# Patient Record
Sex: Female | Born: 1975 | Race: White | Hispanic: No | Smoking: Never smoker
Health system: Southern US, Community
[De-identification: ages and names within clinical notes are randomized; demographics above are authoritative.]

## PROBLEM LIST (undated history)

## (undated) DIAGNOSIS — D6851 Activated protein C resistance: Secondary | ICD-10-CM

## (undated) DIAGNOSIS — K589 Irritable bowel syndrome without diarrhea: Secondary | ICD-10-CM

## (undated) DIAGNOSIS — I10 Essential (primary) hypertension: Secondary | ICD-10-CM

## (undated) HISTORY — PX: TONSILLECTOMY: SUR1361

## (undated) HISTORY — DX: Activated protein C resistance: D68.51

## (undated) HISTORY — DX: Irritable bowel syndrome without diarrhea: K58.9

## (undated) HISTORY — DX: Essential (primary) hypertension: I10

---

## 2010-12-11 ENCOUNTER — Ambulatory Visit: Payer: Self-pay | Admitting: Internal Medicine

## 2010-12-17 ENCOUNTER — Ambulatory Visit: Payer: Self-pay | Admitting: Family Medicine

## 2010-12-18 ENCOUNTER — Emergency Department: Payer: Self-pay | Admitting: Emergency Medicine

## 2010-12-19 ENCOUNTER — Emergency Department: Payer: Self-pay | Admitting: *Deleted

## 2010-12-23 ENCOUNTER — Ambulatory Visit: Payer: Self-pay | Admitting: Surgery

## 2012-03-01 HISTORY — PX: AUGMENTATION MAMMAPLASTY: SUR837

## 2015-03-02 HISTORY — PX: MYOMECTOMY: SHX85

## 2017-05-09 LAB — HM PAP SMEAR: HM Pap smear: NEGATIVE

## 2019-03-19 ENCOUNTER — Other Ambulatory Visit: Payer: Self-pay | Admitting: Family

## 2019-03-19 DIAGNOSIS — Z1231 Encounter for screening mammogram for malignant neoplasm of breast: Secondary | ICD-10-CM

## 2019-03-27 ENCOUNTER — Ambulatory Visit
Admission: RE | Admit: 2019-03-27 | Discharge: 2019-03-27 | Disposition: A | Discharge status: Home / Self Care | Payer: Managed Care, Other (non HMO) | Source: Ambulatory Visit | Attending: Family | Admitting: Family

## 2019-03-27 ENCOUNTER — Other Ambulatory Visit: Payer: Self-pay

## 2019-03-27 ENCOUNTER — Other Ambulatory Visit: Payer: Self-pay | Admitting: Family

## 2019-03-27 ENCOUNTER — Encounter (INDEPENDENT_AMBULATORY_CARE_PROVIDER_SITE_OTHER): Payer: Self-pay

## 2019-03-27 DIAGNOSIS — Z1231 Encounter for screening mammogram for malignant neoplasm of breast: Secondary | ICD-10-CM | POA: Insufficient documentation

## 2019-04-11 ENCOUNTER — Other Ambulatory Visit: Payer: Self-pay | Admitting: Family

## 2019-04-11 DIAGNOSIS — N6310 Unspecified lump in the right breast, unspecified quadrant: Secondary | ICD-10-CM

## 2019-04-11 DIAGNOSIS — N6489 Other specified disorders of breast: Secondary | ICD-10-CM

## 2019-04-11 DIAGNOSIS — R928 Other abnormal and inconclusive findings on diagnostic imaging of breast: Secondary | ICD-10-CM

## 2019-04-17 ENCOUNTER — Other Ambulatory Visit: Payer: Managed Care, Other (non HMO)

## 2019-04-17 ENCOUNTER — Ambulatory Visit: Payer: Managed Care, Other (non HMO)

## 2019-04-20 ENCOUNTER — Ambulatory Visit
Admission: RE | Admit: 2019-04-20 | Discharge: 2019-04-20 | Disposition: A | Discharge status: Home / Self Care | Payer: Managed Care, Other (non HMO) | Source: Ambulatory Visit | Attending: Family | Admitting: Family

## 2019-04-20 ENCOUNTER — Other Ambulatory Visit: Payer: Self-pay | Admitting: Family

## 2019-04-20 DIAGNOSIS — N6489 Other specified disorders of breast: Secondary | ICD-10-CM

## 2019-04-20 DIAGNOSIS — R928 Other abnormal and inconclusive findings on diagnostic imaging of breast: Secondary | ICD-10-CM | POA: Diagnosis not present

## 2019-04-20 DIAGNOSIS — N6310 Unspecified lump in the right breast, unspecified quadrant: Secondary | ICD-10-CM | POA: Insufficient documentation

## 2019-11-06 ENCOUNTER — Ambulatory Visit: Payer: Self-pay | Admitting: Family Medicine

## 2019-11-06 ENCOUNTER — Encounter: Payer: Self-pay | Admitting: Family Medicine

## 2019-11-26 ENCOUNTER — Other Ambulatory Visit: Payer: Self-pay | Admitting: Family Medicine

## 2019-11-26 ENCOUNTER — Other Ambulatory Visit: Payer: Self-pay

## 2019-11-26 ENCOUNTER — Encounter: Payer: Self-pay | Admitting: Family Medicine

## 2019-11-26 ENCOUNTER — Ambulatory Visit: Payer: Managed Care, Other (non HMO) | Admitting: Family Medicine

## 2019-11-26 VITALS — BP 127/72 | HR 80 | Temp 98.6°F | Resp 18 | Ht 63.0 in | Wt 143.2 lb

## 2019-11-26 DIAGNOSIS — R000 Tachycardia, unspecified: Secondary | ICD-10-CM

## 2019-11-26 DIAGNOSIS — Z7689 Persons encountering health services in other specified circumstances: Secondary | ICD-10-CM | POA: Insufficient documentation

## 2019-11-26 DIAGNOSIS — I10 Essential (primary) hypertension: Secondary | ICD-10-CM | POA: Diagnosis not present

## 2019-11-26 DIAGNOSIS — K589 Irritable bowel syndrome without diarrhea: Secondary | ICD-10-CM

## 2019-11-26 DIAGNOSIS — D6851 Activated protein C resistance: Secondary | ICD-10-CM | POA: Diagnosis not present

## 2019-11-26 DIAGNOSIS — Z23 Encounter for immunization: Secondary | ICD-10-CM | POA: Insufficient documentation

## 2019-11-26 MED ORDER — BLOOD PRESSURE KIT: PACK | 0 refills | Status: AC

## 2019-11-26 NOTE — Assessment & Plan Note (Signed)
Hx of hypertension, started on lisinopril 2.5mg  in August of 2021, labs completed just prior to starting on lisinopril.  No repeat labs to evaluate kidney function s/p beginning an ACEi, will order this today.  Appears wrist cuff is not as accurate and explained to patient, would likely benefit more from an upper arm BP cuff and can write a prescription for this.  Will have her take her BP 1-2x per day for the next 2 weeks and if showing a consistent elevation in BP > 130/80, will increase lisinopril to 5mg  daily.  Plan: 1. Have labs drawn in the next 1-2 weeks 2. Begin using new BP cuff that is for the upper arm 3. Log BP 1-2x per day for the next 2 weeks and contact if BP is consistently > 130/80 4. RTC in 3 months

## 2019-11-26 NOTE — Assessment & Plan Note (Signed)
Tachycardia upon entering clinic which then resolved and had a HR in the 80's.  Family history of thyroid disorder, will have labs ordered for evaluation of tachycardia.  Reports HR at home in the 60's typically.  Plan: 1. Have labs drawn in the next 1-2 weeks

## 2019-11-26 NOTE — Assessment & Plan Note (Signed)
Reports stable and has been taking ASA 81mg  for this for control.

## 2019-11-26 NOTE — Assessment & Plan Note (Signed)
Long standing history, has been taking a probiotic, fiber and pepcid for control.  Denies any concerns currently.

## 2019-11-26 NOTE — Progress Notes (Signed)
Subjective:    Patient ID: Megan Ortega, female    DOB: Jan 11, 1976, 44 y.o.   MRN: [id number]  Megan Ortega is a 44 y.o. female presenting on 11/26/2019 for Establish Care (hypertension, pt started bp medication in August and been having some elevated bp reading ranging 130/150- 75-88 )   HPI  Previous PCP was at [facility] in [place].  Records will not be requested, as are available in [place].  Past medical, family, and surgical history reviewed w/ pt.  She has acute concerns today for elevated blood pressure readings while at home.  She has been taking her blood pressure with a wrist cuff and has readings that vary 115-155 SBP/78-93 DBP.  Reports no change in how she is feeling with high or low BP readings.  Today in clinic her BP was 127/72 and then taken with her wrist cuff showing a SBP of 144.  Has taken her BP medications today.     Depression screen PHQ 2/9 11/26/2019  Decreased Interest 0  Down, Depressed, Hopeless 0  PHQ - 2 Score 0    Social History   Tobacco Use  . Smoking status: Never Smoker  . Smokeless tobacco: Never Used  Vaping Use  . Vaping Use: Never used  Substance Use Topics  . Alcohol use: Never  . Drug use: Never    Review of Systems  Constitutional: Negative.   HENT: Negative.   Eyes: Negative.   Respiratory: Negative.   Cardiovascular: Negative.   Gastrointestinal: Negative.   Endocrine: Negative.   Genitourinary: Negative.   Musculoskeletal: Negative.   Skin: Negative.   Allergic/Immunologic: Negative.   Neurological: Negative.   Hematological: Negative.   Psychiatric/Behavioral: Negative.    Per HPI unless specifically indicated above     Objective:    BP 127/72 (BP Location: Left Arm, Patient Position: Sitting, Cuff Size: Normal)   Pulse 80   Temp 98.6 F (37 C) (Oral)   Resp 18   Ht 5' 3" (1.6 m)   Wt 143 lb 3.2 oz (65 kg)   SpO2 100%   BMI 25.37 kg/m   Wt Readings from Last 3  Encounters:  11/26/19 143 lb 3.2 oz (65 kg)    Physical Exam Vitals reviewed.  Constitutional:      General: She is not in acute distress.    Appearance: Normal appearance. She is well-developed, well-groomed and overweight. She is not ill-appearing or toxic-appearing.  HENT:     Head: Normocephalic and atraumatic.     Nose:     Comments: Lizbeth Bark is in place, covering mouth and nose. Eyes:     General: Lids are normal. Vision grossly intact.        Right eye: No discharge.        Left eye: No discharge.     Extraocular Movements: Extraocular movements intact.     Conjunctiva/sclera: Conjunctivae normal.     Pupils: Pupils are equal, round, and reactive to light.  Cardiovascular:     Rate and Rhythm: Normal rate and regular rhythm.     Pulses: Normal pulses.     Heart sounds: Normal heart sounds. No murmur heard.  No friction rub. No gallop.   Pulmonary:     Effort: Pulmonary effort is normal. No respiratory distress.     Breath sounds: Normal breath sounds.  Skin:    General: Skin is warm and dry.     Capillary Refill: Capillary refill takes less than 2 seconds.  Neurological:     General: No focal deficit present.     Mental Status: She is alert and oriented to person, place, and time.  Psychiatric:        Attention and Perception: Attention and perception normal.        Mood and Affect: Mood and affect normal.        Speech: Speech normal.        Behavior: Behavior normal. Behavior is cooperative.        Thought Content: Thought content normal.        Cognition and Memory: Cognition and memory normal.        Judgment: Judgment normal.    No results found for this or any previous visit.    Assessment & Plan:   Problem List Items Addressed This Visit      Cardiovascular and Mediastinum   Essential hypertension    Hx of hypertension, started on lisinopril 2.61m in August of 2021, labs completed just prior to starting on lisinopril.  No repeat labs to evaluate kidney  function s/p beginning an ACEi, will order this today.  Appears wrist cuff is not as accurate and explained to patient, would likely benefit more from an upper arm BP cuff and can write a prescription for this.  Will have her take her BP 1-2x per day for the next 2 weeks and if showing a consistent elevation in BP > 130/80, will increase lisinopril to 520mdaily.  Plan: 1. Have labs drawn in the next 1-2 weeks 2. Begin using new BP cuff that is for the upper arm 3. Log BP 1-2x per day for the next 2 weeks and contact if BP is consistently > 130/80 4. RTC in 3 months      Relevant Medications   lisinopril (ZESTRIL) 2.5 MG tablet   aspirin 81 MG chewable tablet   Blood Pressure KIT     Digestive   IBS (irritable bowel syndrome)    Long standing history, has been taking a probiotic, fiber and pepcid for control.  Denies any concerns currently.      Relevant Medications   Famotidine (PEPCID AC PO)   Psyllium (METAMUCIL) 0.36 g CAPS   Probiotic Product (PROBIOTIC-10 PO)     Hematopoietic and Hemostatic   Factor V Leiden mutation ([facility]   Reports stable and has been taking ASA 8137mor this for control.        Other   Tachycardia    Tachycardia upon entering clinic which then resolved and had a HR in the 80's.  Family history of thyroid disorder, will have labs ordered for evaluation of tachycardia.  Reports HR at home in the 60's typically.  Plan: 1. Have labs drawn in the next 1-2 weeks      Relevant Orders   CMP14+EGFR   CBC with Differential   TSH + free T4   Encounter to establish care with new doctor - Primary    [facility].  [facility]  [facility] to reflect results.  Plan: 1. Have labs drawn in the next 1-2 weeks 2. RTC in 3 months for CPE         Meds ordered this encounter    Medications  . Blood Pressure KIT    Sig: Please allow to pick from covered machines  Dispense:  1 kit    Refill:  0    Follow up plan: Return in about 3 months (around 02/25/2020) for CPE.   Harlin Rain, [place] Family Nurse Practitioner [facility] 11/26/2019, 12:00 PM

## 2019-11-26 NOTE — Assessment & Plan Note (Signed)
New patient establishment at [facility] for primary care services.  Previous PCP was with [facility] and previous OB/GYN with [address].  Records are in [facility], will obtain and update our chart to reflect results.  Plan: 1. Have labs drawn in the next 1-2 weeks 2. RTC in 3 months for CPE

## 2019-11-26 NOTE — Patient Instructions (Signed)
Have your labs drawn and we will contact you with the results.  Take your blood pressure twice per day with an arm cuff and record these numbers.  If you are finding you are consistently greater than 130/80, please contact me sooner and we can discuss increasing your lisinopril.  Continue lisinopril 2.5mg  once daily.    Some of the possible side effects are:  - angioedema: swelling of lips, mouth, and tongue.  If this rare side effect occurs, please go to ED. - cough: you could develop a dry, hacking cough caused by this medicine.  If it occurs, it will go away after stopping this medicine.  Call the clinic before stopping the medication. - kidney damage: we will monitor your labs when we start this medicine and at least one time per year.  If you do not have an change in kidney function when starting this medicine, it will provide kidney protection over time.  We will plan to see you back in 3 months for your physical  You will receive a survey after today's visit either digitally by e-mail or paper by USPS mail. Your experiences and feedback matter to [place].  Please respond so we know how we are doing as we provide care for you.  Call us with any questions/concerns/needs.  It is my goal to be available to you for your health concerns.  Thanks for choosing me to be a partner in your healthcare needs!  Charlaine Dalton, FNP-C Family Nurse Practitioner [facility] Phone: [phone]

## 2019-11-27 LAB — CMP14+EGFR
ALT: 13 IU/L (ref 0–32)
AST: 20 IU/L (ref 0–40)
Albumin/Globulin Ratio: 2 (ref 1.2–2.2)
Albumin: 4.6 g/dL (ref 3.8–4.8)
Alkaline Phosphatase: 58 IU/L (ref 44–121)
BUN/Creatinine Ratio: 18 (ref 9–23)
BUN: 13 mg/dL (ref 6–24)
Bilirubin Total: 0.4 mg/dL (ref 0.0–1.2)
CO2: 25 mmol/L (ref 20–29)
Calcium: 8.8 mg/dL (ref 8.7–10.2)
Chloride: 99 mmol/L (ref 96–106)
Creatinine, Ser: 0.72 mg/dL (ref 0.57–1.00)
GFR calc Af Amer: 118 mL/min/{1.73_m2} (ref >59)
GFR calc non Af Amer: 102 mL/min/{1.73_m2} (ref >59)
Globulin, Total: 2.3 g/dL (ref 1.5–4.5)
Glucose: 87 mg/dL (ref 65–99)
Potassium: 4.1 mmol/L (ref 3.5–5.2)
Sodium: 139 mmol/L (ref 134–144)
Total Protein: 6.9 g/dL (ref 6.0–8.5)

## 2019-11-27 LAB — CBC WITH DIFFERENTIAL/PLATELET
Basophils Absolute: 0.1 10*3/uL (ref 0.0–0.2)
Basos: 1 %
EOS (ABSOLUTE): 0.2 10*3/uL (ref 0.0–0.4)
Eos: 3 %
Hematocrit: 42.5 % (ref 34.0–46.6)
Hemoglobin: 14.2 g/dL (ref 11.1–15.9)
Immature Grans (Abs): 0 10*3/uL (ref 0.0–0.1)
Immature Granulocytes: 0 %
Lymphocytes Absolute: 1.9 10*3/uL (ref 0.7–3.1)
Lymphs: 27 %
MCH: 30.4 pg (ref 26.6–33.0)
MCHC: 33.4 g/dL (ref 31.5–35.7)
MCV: 91 fL (ref 79–97)
Monocytes Absolute: 0.4 10*3/uL (ref 0.1–0.9)
Monocytes: 6 %
Neutrophils Absolute: 4.5 10*3/uL (ref 1.4–7.0)
Neutrophils: 63 %
Platelets: 301 10*3/uL (ref 150–450)
RBC: 4.67 x10E6/uL (ref 3.77–5.28)
RDW: 12.7 % (ref 11.7–15.4)
WBC: 7.1 10*3/uL (ref 3.4–10.8)

## 2019-11-27 LAB — TSH+FREE T4
Free T4: 1.45 ng/dL (ref 0.82–1.77)
TSH: 0.944 u[IU]/mL (ref 0.450–4.500)

## 2019-12-04 ENCOUNTER — Encounter: Payer: Self-pay | Admitting: Family Medicine

## 2019-12-04 ENCOUNTER — Other Ambulatory Visit: Payer: Self-pay | Admitting: Family Medicine

## 2019-12-04 DIAGNOSIS — I10 Essential (primary) hypertension: Secondary | ICD-10-CM

## 2019-12-04 MED ORDER — LISINOPRIL 2.5 MG PO TABS: 2.5000 mg | ORAL_TABLET | Freq: Every day | ORAL | 1 refills | Status: DC

## 2019-12-10 ENCOUNTER — Telehealth: Payer: Self-pay

## 2019-12-10 DIAGNOSIS — I10 Essential (primary) hypertension: Secondary | ICD-10-CM

## 2019-12-10 MED ORDER — LISINOPRIL 2.5 MG PO TABS: 2.5000 mg | ORAL_TABLET | Freq: Every day | ORAL | 1 refills | Status: DC

## 2019-12-10 NOTE — Telephone Encounter (Signed)
Prescription sent to [facility] in [place].

## 2019-12-12 ENCOUNTER — Other Ambulatory Visit: Payer: Self-pay

## 2019-12-12 ENCOUNTER — Ambulatory Visit (INDEPENDENT_AMBULATORY_CARE_PROVIDER_SITE_OTHER): Payer: Managed Care, Other (non HMO)

## 2019-12-12 DIAGNOSIS — Z23 Encounter for immunization: Secondary | ICD-10-CM

## 2020-02-27 ENCOUNTER — Other Ambulatory Visit: Payer: Self-pay | Admitting: Family Medicine

## 2020-02-27 DIAGNOSIS — I10 Essential (primary) hypertension: Secondary | ICD-10-CM

## 2020-03-15 ENCOUNTER — Other Ambulatory Visit: Payer: Self-pay

## 2020-03-15 ENCOUNTER — Other Ambulatory Visit: Payer: Managed Care, Other (non HMO)

## 2020-03-15 DIAGNOSIS — Z20822 Contact with and (suspected) exposure to covid-19: Secondary | ICD-10-CM

## 2020-03-18 LAB — NOVEL CORONAVIRUS, NAA: SARS-CoV-2, NAA: NOT DETECTED

## 2020-04-03 ENCOUNTER — Other Ambulatory Visit: Payer: Managed Care, Other (non HMO)

## 2020-04-03 DIAGNOSIS — Z20822 Contact with and (suspected) exposure to covid-19: Secondary | ICD-10-CM

## 2020-04-04 LAB — NOVEL CORONAVIRUS, NAA: SARS-CoV-2, NAA: NOT DETECTED

## 2020-04-04 LAB — SARS-COV-2, NAA 2 DAY TAT

## 2020-04-22 ENCOUNTER — Other Ambulatory Visit: Payer: Self-pay | Admitting: Family Medicine

## 2020-04-22 DIAGNOSIS — Z1231 Encounter for screening mammogram for malignant neoplasm of breast: Secondary | ICD-10-CM

## 2020-05-06 ENCOUNTER — Encounter: Payer: Self-pay | Admitting: Family Medicine

## 2020-05-06 ENCOUNTER — Ambulatory Visit (INDEPENDENT_AMBULATORY_CARE_PROVIDER_SITE_OTHER): Payer: Managed Care, Other (non HMO) | Admitting: Family Medicine

## 2020-05-06 ENCOUNTER — Other Ambulatory Visit: Payer: Self-pay

## 2020-05-06 VITALS — BP 134/88 | HR 91 | Temp 97.7°F | Ht 63.0 in | Wt 150.4 lb

## 2020-05-06 DIAGNOSIS — H6983 Other specified disorders of Eustachian tube, bilateral: Secondary | ICD-10-CM | POA: Diagnosis not present

## 2020-05-06 DIAGNOSIS — I10 Essential (primary) hypertension: Secondary | ICD-10-CM

## 2020-05-06 DIAGNOSIS — Z1231 Encounter for screening mammogram for malignant neoplasm of breast: Secondary | ICD-10-CM

## 2020-05-06 NOTE — Progress Notes (Signed)
Subjective:    Patient ID: Megan Ortega, female    DOB: 1975-12-18, 45 y.o.   MRN: [id number]  Megan Ortega is a 45 y.o. female presenting on 05/06/2020 for Hypertension (Pt would like to increase dosage or change medication to help with heart pulse since she notice her heart pulse is always high./Pt would like a referral for a mammogram.) and Ear Pain (It has been going on for several months on both ears but mainly her right ear. Right ear seems to be more clogged up. Pt has been applying ear drops to help but she believes it has not been helping.)  Previous PCP Danielle Rankin, FNP - prior to that prior PCP in [place].  HPI   CHRONIC HTN: Reports prior history she had elevated BP in past at dentist office. She had better BP readings on manual cuff, and she had higher readings on electronic cuffs, she got a wrist cuff at home with higher readings. Manual readings often were better still. She did some nurse check on BP in office. Since moving to [place], she has had still elevated BP readings. Current Meds - Lisinopril 2.5mg  daily   Reports good compliance, took meds today. Tolerating well, w/o complaints. Lifestyle: - Diet: limited sodium but some processed foods - Fam history of heart disease, father passed away early. Denies CP, dyspnea, HA, edema, dizziness / lightheadedness  Ear Pain R ear, has some fluid trapped in ear Uses OTC drops to help remove fluid in ear. Still persistent, clogged sensation at times.   Health Maintenance:  Breast CA Screening w/ Breast Implants :  Due for mammogram screening. Last mammogram result 03/2019 showed cyst indeterminate result, had diagnostic and ultrasound done next 04/2020, negative with cyst, repeat 1 year. She has had recurrent breast cysts for long time. No known family history of breast cancer. Currently asymptomatic.   Depression screen PHQ 2/9 11/26/2019  Decreased Interest 0  Down, Depressed, Hopeless 0  PHQ - 2 Score 0    Social  History   Tobacco Use  . Smoking status: Never Smoker  . Smokeless tobacco: Never Used  Vaping Use  . Vaping Use: Never used  Substance Use Topics  . Alcohol use: Never  . Drug use: Never    Review of Systems Per HPI unless specifically indicated above     Objective:    BP 134/88 (BP Location: Left Arm, Cuff Size: Normal)   Pulse 91   Temp 97.7 F (36.5 C) (Temporal)   Ht 5\' 3"  (1.6 m)   Wt 150 lb 6.4 oz (68.2 kg)   SpO2 100%   BMI 26.64 kg/m   Wt Readings from Last 3 Encounters:  05/06/20 150 lb 6.4 oz (68.2 kg)  11/26/19 143 lb 3.2 oz (65 kg)    Physical Exam Vitals and nursing note reviewed.  Constitutional:      General: She is not in acute distress.    Appearance: She is well-developed and well-nourished. She is not diaphoretic.     Comments: Well-appearing, comfortable, cooperative  HENT:     Head: Normocephalic and atraumatic.     Right Ear: Ear canal and external ear normal.     Left Ear: Ear canal and external ear normal.     Ears:     Comments: Bilateral TM with some mild cloudy effusion, no bulging no erythema, some dry wax deeper in present not impacted    Mouth/Throat:     Mouth: Oropharynx is clear and moist.  Eyes:     General:        Right eye: No discharge.        Left eye: No discharge.     Conjunctiva/sclera: Conjunctivae normal.  Cardiovascular:     Rate and Rhythm: Normal rate.  Pulmonary:     Effort: Pulmonary effort is normal.  Musculoskeletal:        General: No edema.  Skin:    General: Skin is warm and dry.     Findings: No erythema or rash.  Neurological:     Mental Status: She is alert and oriented to person, place, and time.  Psychiatric:        Mood and Affect: Mood and affect normal.        Behavior: Behavior normal.     Comments: Well groomed, good eye contact, normal speech and thoughts      I have personally reviewed the radiology report from 03/26/20.  CLINICAL DATA:  Screening.  EXAM: DIGITAL SCREENING  BILATERAL MAMMOGRAM WITH IMPLANTS, CAD AND TOMO  The patient has retropectoral implants. Standard and implant displaced views were performed.  COMPARISON:  Previous exam(s).  ACR Breast Density Category c: The breast tissue is heterogeneously dense, which may obscure small masses.  FINDINGS: In the right breast , a possible mass requires further evaluation. This possible mass is seen within the outer RIGHT breast, at posterior depth, CC implant displaced slice 28.  In the left breast, a possible asymmetry warrants further evaluation. This possible asymmetry is seen within the inner LEFT breast, at posterior depth, CC implant displaced slice 40.  Images were processed with CAD.  IMPRESSION: Further evaluation is suggested for possible mass in the right breast.  Further evaluation is suggested for possible asymmetry in the left breast.  The patient will be contacted regarding the findings, and additional imaging will be scheduled.  RECOMMENDATION: Diagnostic mammogram and possibly ultrasound of both breasts. (Code:FI-B-5M)  BI-RADS CATEGORY  0: Incomplete. Need additional imaging evaluation and/or prior mammograms for comparison.   Electronically Signed   By: Bary Richard M.D.   On: 04/09/2019 14:41  ---------------------------------------------------------   CLINICAL DATA:  Patient returns after screening study for evaluation of possible RIGHT breast mass and possible LEFT breast asymmetry.  EXAM: DIGITAL DIAGNOSTIC BILATERAL MAMMOGRAM WITH IMPLANTS, CAD AND TOMO  ULTRASOUND RIGHT BREAST  The patient has retropectoral implants. Standard and implant displaced views were performed.  COMPARISON:  Previous exam(s).  ACR Breast Density Category c: The breast tissue is heterogeneously dense, which may obscure small masses.  FINDINGS: Right breast:  Mammogram: Additional 2-D and 3-D images are performed. These views confirm presence of  an oval circumscribed mass in the LATERAL portion of the RIGHT breast. Mammographic images were processed with CAD.  Ultrasound: Targeted ultrasound is performed, showing small benign cysts within the 10 o'clock location of the RIGHT breast, largest measuring 1.1 x 0.7 x 1.0 centimeters. Other small cysts are less than 1 centimeter in diameter. No solid masses or areas of acoustic shadowing.  Left breast:  Mammogram: Additional 2-D and 3-D images are performed. These views show no persistent abnormality in the LATERAL aspect of the LEFT breast. No suspicious mass, distortion, or microcalcifications are identified to suggest presence of malignancy. Mammographic images were processed with CAD.  IMPRESSION: 1. Benign cysts within the RIGHT breast. 2.  No mammographic or ultrasound evidence for malignancy.  RECOMMENDATION: Screening mammogram in one year.(Code:SM-B-01Y)  I have discussed the findings and recommendations with the patient. If applicable,  a reminder letter will be sent to the patient regarding the next appointment.  BI-RADS CATEGORY  2: Benign.   Electronically Signed   By: Norva Pavlov M.D.   On: 04/20/2019 16:03  Results for orders placed or performed in visit on 04/03/20  Novel Coronavirus, NAA ([facility])   Specimen: Nasopharyngeal(NP) swabs in vial transport medium   Nasopharynge  Result Value Ref Range   SARS-CoV-2, NAA Not Detected Not Detected  SARS-COV-2, NAA 2 DAY TAT   Nasopharynge  Result Value Ref Range   SARS-CoV-2, NAA 2 DAY TAT Performed       Assessment & Plan:   Problem List Items Addressed This Visit    Essential hypertension   Relevant Medications   lisinopril (ZESTRIL) 5 MG tablet    Other Visit Diagnoses    Encounter for screening mammogram for malignant neoplasm of breast    -  Primary   Relevant Orders   MM 3D SCREEN BREAST W/IMPLANT BILATERAL   Dysfunction of both eustachian tubes          #Eustachian Tube  Dysfunction Likely acute on chronic issue Some wax but not impacted Start nasal steroid Flonase 2 sprays in each nostril daily for 4-6 weeks, may repeat course seasonally or as needed May use OTC Debrox for wax Limit Qtip  #Mammogram due Ordered for [facility] - 3D Mammo Tomo w/ Implants Asymptomatic currently Prior Cysts, has had benign dx/ultrasound after Follow-up  #Hypertension Review home readings, show mild elevated range occasional >140+ systolic Very low dose Lisinopril 2.5mg  daily - question if effective Prior chemistry normal on ACEi Will increase dose take x 2 of 2.5mg  = 5mg  daily - use home supply of med now, trial for 1 week, message or call back with BP readings, if doing well we can keep on 5mg  and new order or if still elevated >135/85 then we will inc to Lisiniopril 10mg  daily. Limit other factors for HTN Follow-up closely - future consider 2nd agent, may benefit from beta blocker with some elevated HR and history of palpitations, but for now keep ACEi only   No orders of the defined types were placed in this encounter.     Follow up plan: Return in about 3 months (around 08/06/2020) for 3 month follow-up with new provider NP for HTN, med adjust.   , DO [facility] 05/06/2020, 3:45 PM

## 2020-05-06 NOTE — Patient Instructions (Addendum)
Thank you for coming to the office today.  For Mammogram screening for breast cancer 3D Tomo w/ bilateral implant order.  Call the [facility] below anytime to schedule your own appointment now that order has been placed.  [facility] [place], [place] [address] Phone: [phone]  ---------------------------  Increase Lisinopril 2.70m x 2 = 534mdaily, try for 1 week, log BP readings, goal is < 135/85 consistently, if we see really any >140+ we would need to increase once more.  Call or message within 1 week, before you run out of medicine, we can order MORE of the [id number] if you are doing well, if not doing as well - and higher readings - we can order the [id number] for you.  OTC - Start nasal steroid Flonase 2 sprays in each nostril daily for 4-6 weeks, may repeat course seasonally or as needed  Some possible fluid from sinuses BEHIND ear drum.  Try a Debrox Wax removal kit if needed   Please schedule a Follow-up Appointment to: Return in about 3 months (around 08/06/2020) for 3 month follow-up with new provider NP for HTN, med adjust.  If you have any other questions or concerns, please feel free to call the office or send a message through [facility] may also schedule an earlier appointment if necessary.  Additionally, you may be receiving a survey about your experience at our office within a few days to 1 week by e-mail or mail. We value your feedback.  AlexNobie [place]

## 2020-05-11 DIAGNOSIS — I10 Essential (primary) hypertension: Secondary | ICD-10-CM

## 2020-05-12 MED ORDER — LISINOPRIL 5 MG PO TABS: 5.0000 mg | ORAL_TABLET | Freq: Every day | ORAL | 1 refills | Status: DC

## 2020-06-09 ENCOUNTER — Ambulatory Visit
Admission: RE | Admit: 2020-06-09 | Discharge: 2020-06-09 | Disposition: A | Discharge status: Home / Self Care | Payer: Managed Care, Other (non HMO) | Source: Ambulatory Visit | Attending: Family Medicine | Admitting: Family Medicine

## 2020-06-09 ENCOUNTER — Other Ambulatory Visit: Payer: Self-pay

## 2020-06-09 DIAGNOSIS — Z1231 Encounter for screening mammogram for malignant neoplasm of breast: Secondary | ICD-10-CM | POA: Diagnosis present

## 2020-07-08 ENCOUNTER — Ambulatory Visit: Payer: Managed Care, Other (non HMO) | Admitting: Internal Medicine

## 2020-10-03 ENCOUNTER — Other Ambulatory Visit: Payer: Self-pay | Admitting: Family Medicine

## 2020-10-03 DIAGNOSIS — I10 Essential (primary) hypertension: Secondary | ICD-10-CM

## 2020-10-05 ENCOUNTER — Other Ambulatory Visit: Payer: Self-pay | Admitting: Family Medicine

## 2020-10-05 DIAGNOSIS — I10 Essential (primary) hypertension: Secondary | ICD-10-CM

## 2020-10-05 NOTE — Telephone Encounter (Signed)
Last RF 10/03/20 #90

## 2021-08-02 IMAGING — MG DIGITAL SCREENING BREAST BILAT IMPLANT W/ TOMO W/ CAD
8 of 16 series · 8 of 40 positions shown · non-contrast
Comparison: Previous exams.

CLINICAL DATA: Screening.

EXAM:
DIGITAL SCREENING BILATERAL MAMMOGRAM WITH IMPLANTS, CAD AND
TOMOSYNTHESIS
TECHNIQUE: Bilateral screening digital craniocaudal and mediolateral oblique
mammograms were obtained. Bilateral screening digital breast
tomosynthesis was performed. The images were evaluated with
computer-aided detection. Standard and/or implant displaced views
were performed.

[R CC]
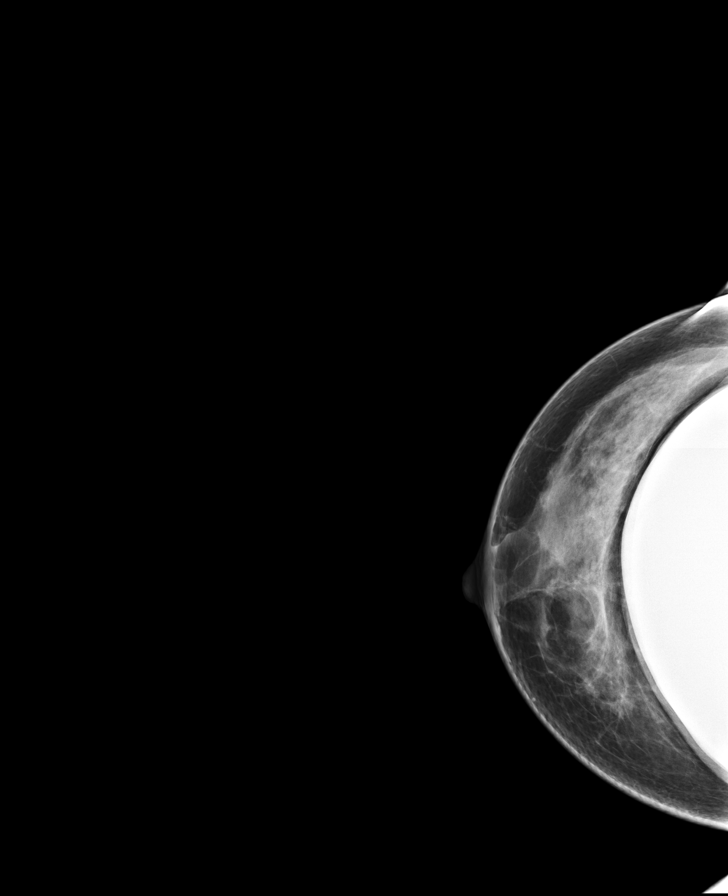

[R MLO]
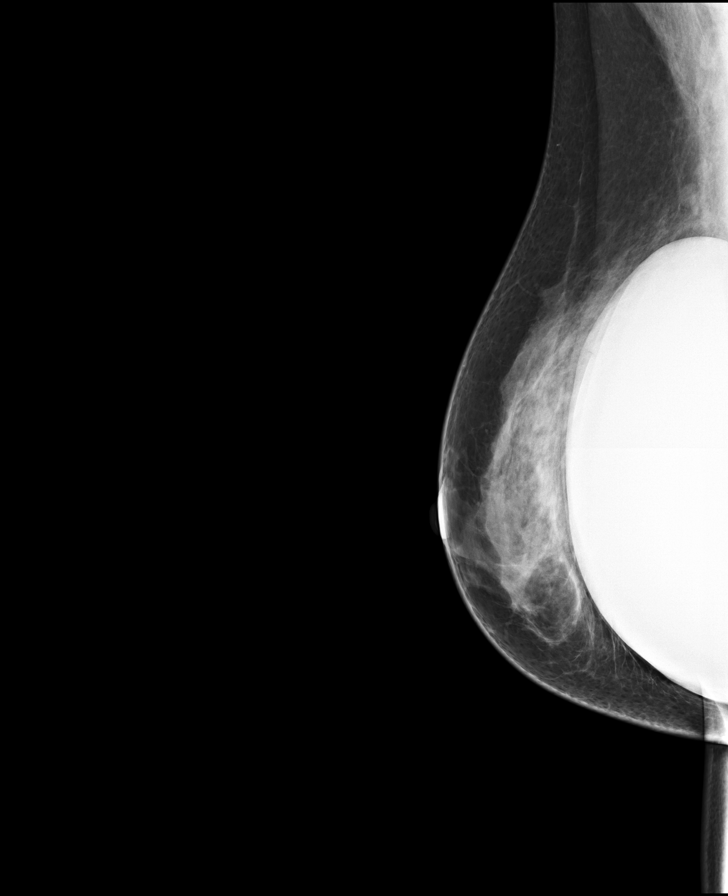

[L CC]
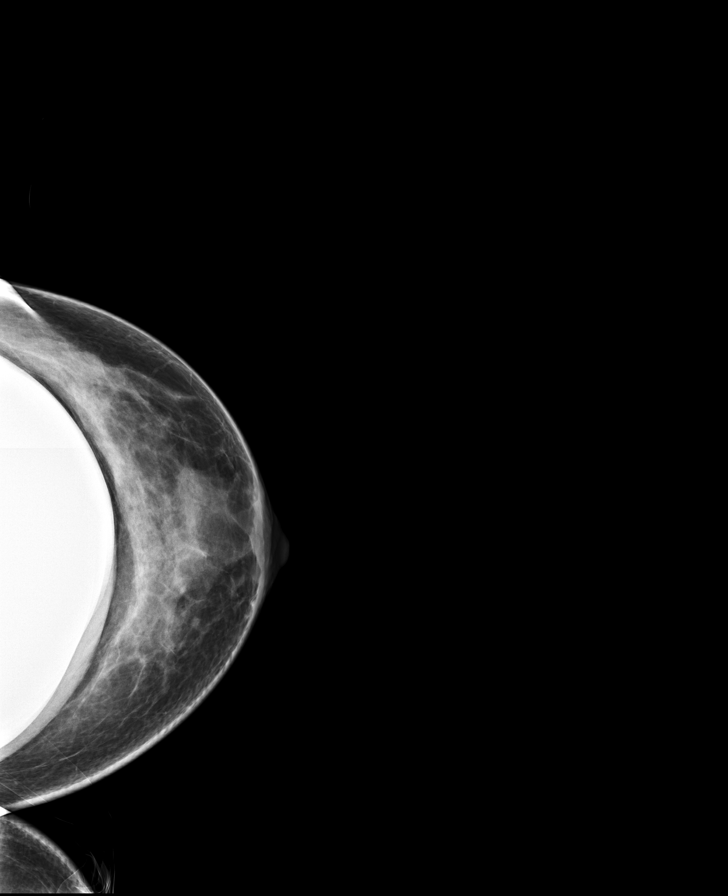

[L MLO]
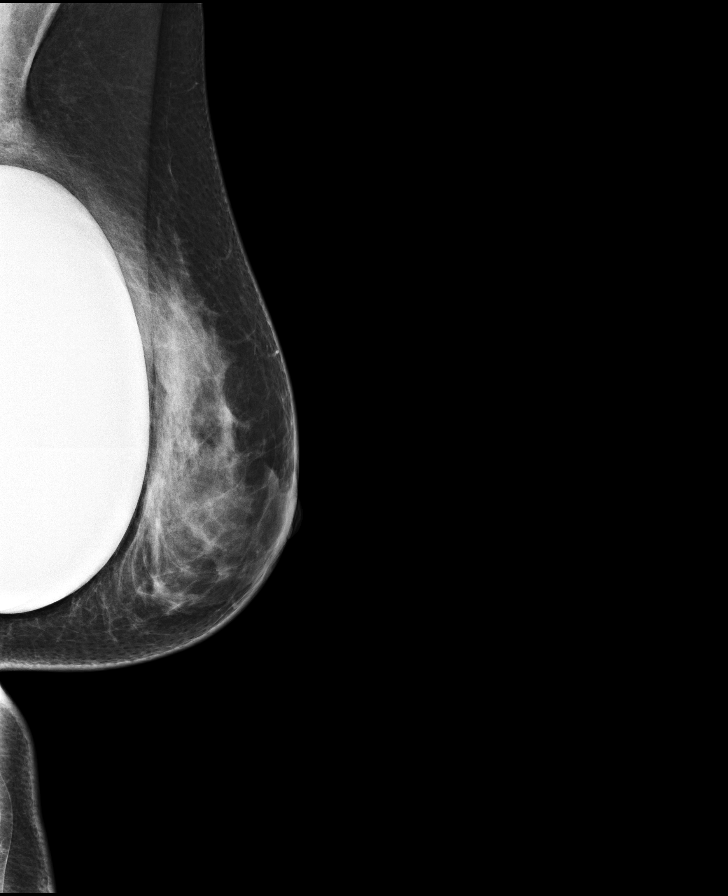

[R CC synth-2D (1 of 2)]
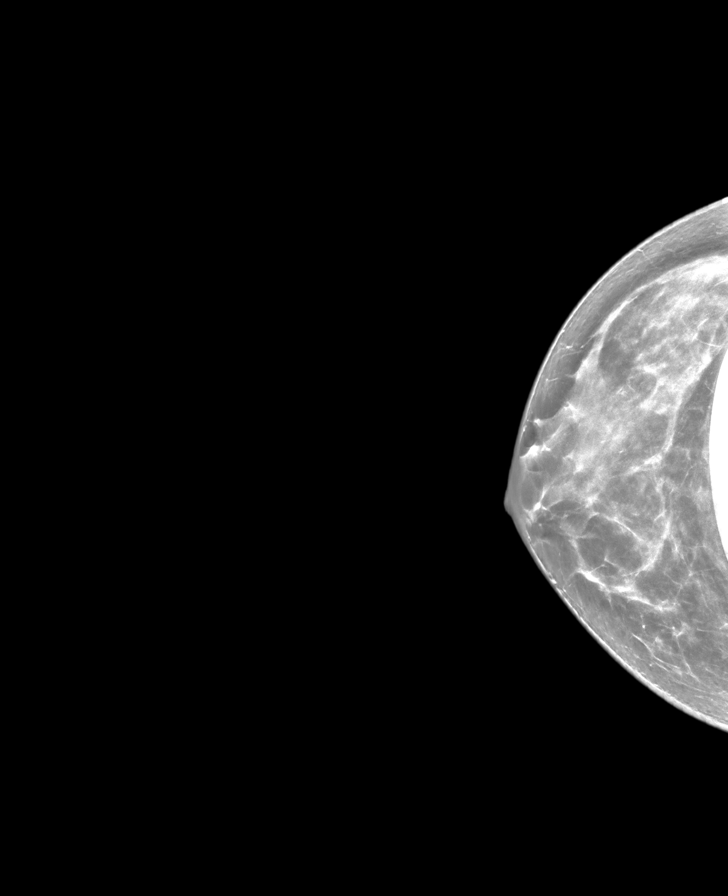

[R CC synth-2D (2 of 2)]
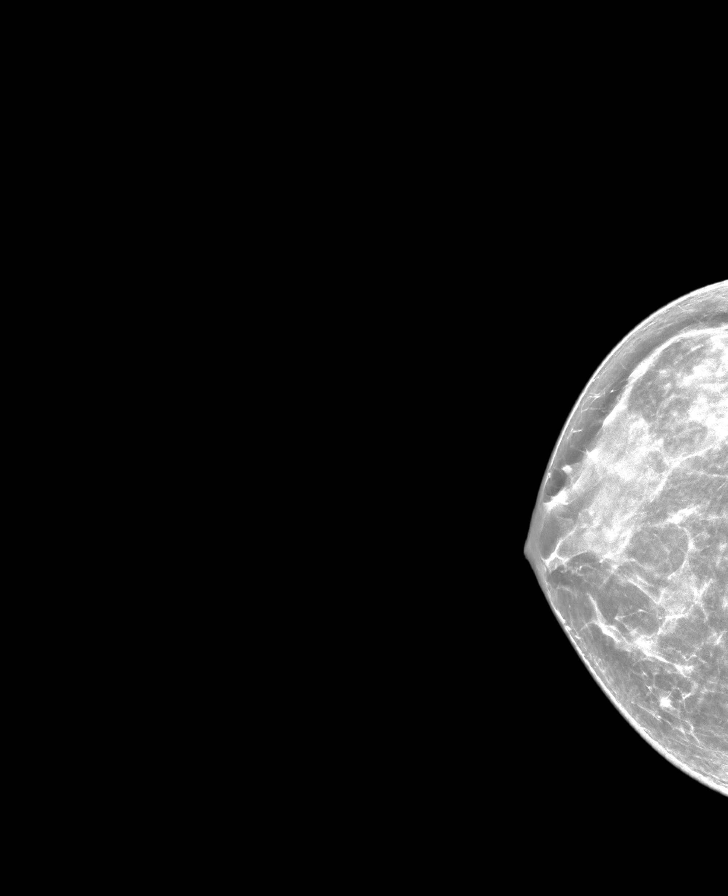

[L MLO synth-2D (1 of 2)]
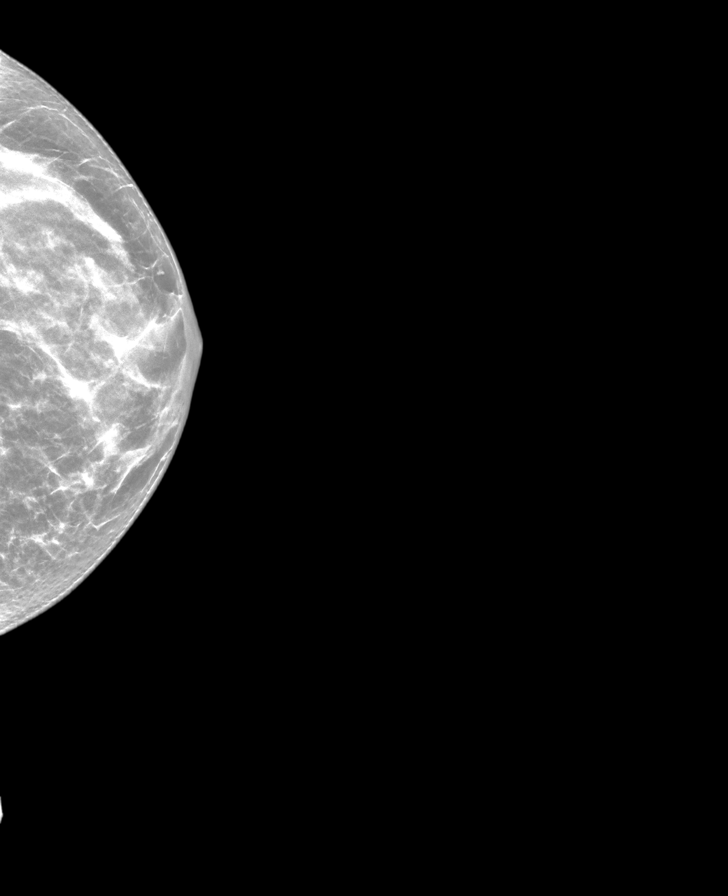

[L MLO synth-2D (2 of 2)]
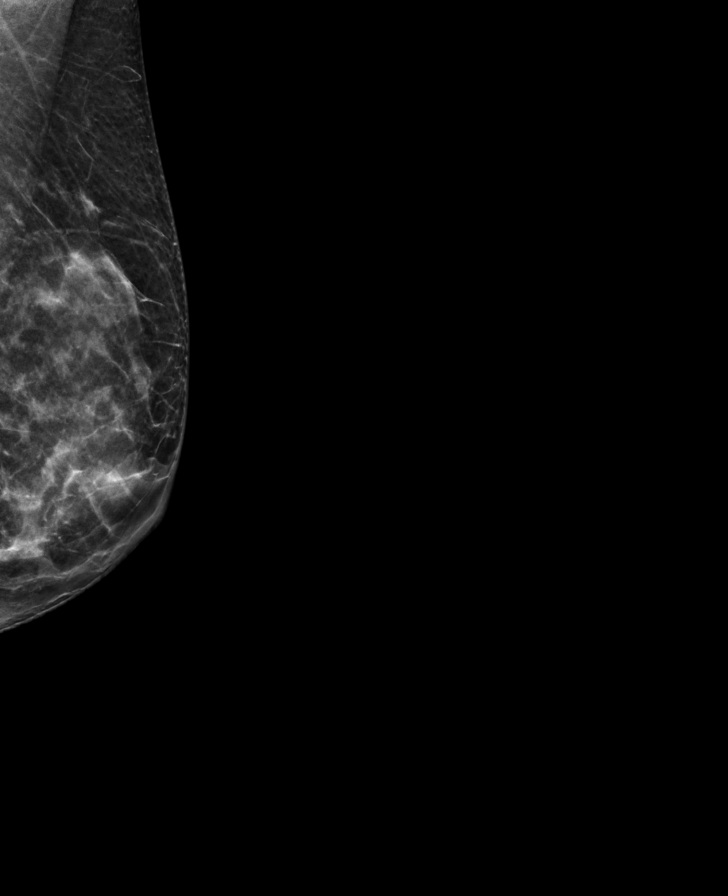

[8 of 40 positions shown; findings below may reference images not displayed]

ACR Breast Density Category c: The breast tissue is heterogeneously
dense, which may obscure small masses.
FINDINGS: The patient has bilateral retropectoral silicone implants. There are
no findings suspicious for malignancy.
IMPRESSION: No mammographic evidence of malignancy. A result letter of this
screening mammogram will be mailed directly to the patient.

RECOMMENDATION:
Screening mammogram in one year. (Code:5K-4-9YF)

BI-RADS CATEGORY  1:  Negative.
